# Patient Record
Sex: Female | Born: 1995 | Race: Black or African American | Hispanic: No | Marital: Single | Smoking: Never smoker
Health system: Southern US, Community
[De-identification: ages and names within clinical notes are randomized; demographics above are authoritative.]

---

## 2016-08-31 ENCOUNTER — Encounter (HOSPITAL_COMMUNITY): Payer: Self-pay | Admitting: Family Medicine

## 2016-08-31 ENCOUNTER — Ambulatory Visit (INDEPENDENT_AMBULATORY_CARE_PROVIDER_SITE_OTHER): Payer: Self-pay

## 2016-08-31 ENCOUNTER — Ambulatory Visit (HOSPITAL_COMMUNITY)
Admission: EM | Admit: 2016-08-31 | Discharge: 2016-08-31 | Disposition: A | Discharge status: Home / Self Care | Payer: Self-pay | Attending: Internal Medicine | Admitting: Internal Medicine

## 2016-08-31 DIAGNOSIS — S0081XA Abrasion of other part of head, initial encounter: Secondary | ICD-10-CM

## 2016-08-31 DIAGNOSIS — M79641 Pain in right hand: Secondary | ICD-10-CM

## 2016-08-31 DIAGNOSIS — S161XXA Strain of muscle, fascia and tendon at neck level, initial encounter: Secondary | ICD-10-CM

## 2016-08-31 DIAGNOSIS — T148XXA Other injury of unspecified body region, initial encounter: Secondary | ICD-10-CM

## 2016-08-31 DIAGNOSIS — S80211A Abrasion, right knee, initial encounter: Secondary | ICD-10-CM

## 2016-08-31 DIAGNOSIS — S60511A Abrasion of right hand, initial encounter: Secondary | ICD-10-CM

## 2016-08-31 DIAGNOSIS — S60221A Contusion of right hand, initial encounter: Secondary | ICD-10-CM

## 2016-08-31 DIAGNOSIS — M546 Pain in thoracic spine: Secondary | ICD-10-CM

## 2016-08-31 NOTE — Discharge Instructions (Signed)
Keep the abrasions clean with soap and water. Apply ice to the areas of soreness for the next couple days. Then heat to the muscles. Apply ice to the right hand to the area of soreness. Wear the wrist splint for the next 2-3 days to allow the thumb and hand to heal. There are no apparent broken bones, dislocations or other abnormalities on the hand x-ray. Heat to the neck and back muscles.Slow, gentle stretches after a couple of days
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
## 2017-12-11 ENCOUNTER — Emergency Department (HOSPITAL_COMMUNITY): Payer: BLUE CROSS/BLUE SHIELD

## 2017-12-11 ENCOUNTER — Emergency Department (HOSPITAL_COMMUNITY)
Admission: EM | Admit: 2017-12-11 | Discharge: 2017-12-11 | Disposition: A | Discharge status: Home / Self Care | Payer: BLUE CROSS/BLUE SHIELD | Attending: Emergency Medicine | Admitting: Emergency Medicine

## 2017-12-11 ENCOUNTER — Encounter (HOSPITAL_COMMUNITY): Payer: Self-pay | Admitting: Emergency Medicine

## 2017-12-11 ENCOUNTER — Other Ambulatory Visit: Payer: Self-pay

## 2017-12-11 DIAGNOSIS — M545 Low back pain, unspecified: Secondary | ICD-10-CM

## 2017-12-11 DIAGNOSIS — Y999 Unspecified external cause status: Secondary | ICD-10-CM | POA: Diagnosis not present

## 2017-12-11 DIAGNOSIS — Y9389 Activity, other specified: Secondary | ICD-10-CM | POA: Diagnosis not present

## 2017-12-11 LAB — I-STAT BETA HCG BLOOD, ED (MC, WL, AP ONLY): I-stat hCG, quantitative: <5 m[IU]/mL (ref <5)

## 2017-12-11 MED ORDER — ACETAMINOPHEN 500 MG PO TABS
1000.0000 mg | ORAL_TABLET | Freq: Once | ORAL | Status: AC
  Administered 2017-12-11: 1000 mg via ORAL
  Filled 2017-12-11: qty 2

## 2017-12-11 MED ORDER — LIDOCAINE 5 % EX PTCH
1.0000 | MEDICATED_PATCH | CUTANEOUS | Status: DC
  Administered 2017-12-11: 1 via TRANSDERMAL
  Filled 2017-12-11: qty 1

## 2017-12-11 MED ORDER — METHOCARBAMOL 500 MG PO TABS
1000.0000 mg | ORAL_TABLET | Freq: Once | ORAL | Status: AC
  Administered 2017-12-11: 1000 mg via ORAL
  Filled 2017-12-11: qty 2

## 2017-12-11 MED ORDER — METHOCARBAMOL 500 MG PO TABS: 500.0000 mg | ORAL_TABLET | Freq: Three times a day (TID) | ORAL | 0 refills | Status: AC
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
## 2017-12-11 NOTE — ED Notes (Signed)
Patient transported to X-ray 
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
